# Patient Record
Sex: Female | Born: 2000 | Race: White | Hispanic: No | Marital: Single | Smoking: Never smoker
Health system: Southern US, Community
[De-identification: ages and names within clinical notes are randomized; demographics above are authoritative.]

## PROBLEM LIST (undated history)

## (undated) DIAGNOSIS — J45909 Unspecified asthma, uncomplicated: Secondary | ICD-10-CM

---
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
## 2019-11-23 ENCOUNTER — Other Ambulatory Visit: Payer: Self-pay

## 2019-11-23 ENCOUNTER — Encounter: Payer: Self-pay | Admitting: Family Medicine

## 2019-11-23 ENCOUNTER — Ambulatory Visit (INDEPENDENT_AMBULATORY_CARE_PROVIDER_SITE_OTHER): Payer: BC Managed Care – PPO | Admitting: Family Medicine

## 2019-11-23 VITALS — BP 102/60 | HR 91 | Temp 97.1°F | Ht 62.25 in | Wt 227.4 lb

## 2019-11-23 DIAGNOSIS — J4540 Moderate persistent asthma, uncomplicated: Secondary | ICD-10-CM | POA: Diagnosis not present

## 2019-11-23 DIAGNOSIS — Z2882 Immunization not carried out because of caregiver refusal: Secondary | ICD-10-CM | POA: Insufficient documentation

## 2019-11-23 DIAGNOSIS — Z7689 Persons encountering health services in other specified circumstances: Secondary | ICD-10-CM

## 2019-11-23 MED ORDER — ALBUTEROL SULFATE 1.25 MG/3ML IN NEBU: 1.0000 | INHALATION_SOLUTION | Freq: Four times a day (QID) | RESPIRATORY_TRACT | 0 refills | Status: DC | PRN

## 2019-11-23 MED ORDER — QVAR REDIHALER 40 MCG/ACT IN AERB: 1.0000 | INHALATION_SPRAY | Freq: Two times a day (BID) | RESPIRATORY_TRACT | 11 refills | Status: DC

## 2019-11-23 MED ORDER — ALBUTEROL SULFATE HFA 108 (90 BASE) MCG/ACT IN AERS: 2.0000 | INHALATION_SPRAY | RESPIRATORY_TRACT | 2 refills | Status: DC | PRN

## 2019-11-23 NOTE — Progress Notes (Signed)
Subjective:    Patient ID: Sonya Brown, female    DOB: 10-20-2001, 18 y.o.   MRN: [id number]  HPI Chief Complaint  Patient presents with  . New Patient (Initial Visit)    Needs asthma medication refills.    This is a 18 yo female who presents today to establish care. She works in childcare with 2 years ago. Enjoys. Lives with parents and siblings, 78 (oldest brother is married and has son). Enjoys reading, esp. mysteries.   Last CPE- October for work. Tdap- declines Flu-declines Eye- September, glassed for driving/reading Dental- June 2010 Exercise- Walking, hiking Diet- Eats 2-3 meals a day, mom does shopping, cooks some, little fast food. Rare soda, occasional sweet tea/juice. Mostly water. Brings lunch to work, sandwich, ramen, frozen entrees. Eats some fruit, little vegetables. Likes mac and cheese, pizza.  Menses- regular, monthly, heavy, lasting 6 days. Takes ibuprofen.  Sleep- 8 hours, feels rested  Asthma- not doing well for about 3 months. Having to use her albuterol almost every day. Was seen at [facility] 10/20. Was on preventative in 2009. Has had allergy testing in the past, allergic to cat, dogs, mold, pollen. Takes a daily antihistamine. Has cats inside, dog outside.   Past Medical History:  Diagnosis Date  . Asthma    Past Surgical History:  Procedure Laterality Date  . TONSILLECTOMY  2005   Family History  Problem Relation Age of Onset  . Diabetes Mother   . Heart attack Maternal Grandfather   . Heart attack Maternal Uncle    Social History   Tobacco Use  . Smoking status: Never Smoker  . Smokeless tobacco: Never Used  Substance Use Topics  . Alcohol use: No  . Drug use: No    Review of Systems Per HPI    Objective:   Physical Exam Vitals reviewed.  Constitutional:      General: She is not in acute distress.    Appearance: Normal appearance. She is obese. She is not ill-appearing, toxic-appearing or diaphoretic.  HENT:     Head:  Normocephalic and atraumatic.     Right Ear: Tympanic membrane, ear canal and external ear normal.     Left Ear: Tympanic membrane, ear canal and external ear normal.     Nose: Nose normal.     Mouth/Throat:     Mouth: Mucous membranes are dry.     Pharynx: Oropharynx is clear.  Eyes:     Conjunctiva/sclera: Conjunctivae normal.  Cardiovascular:     Rate and Rhythm: Normal rate and regular rhythm.     Heart sounds: Normal heart sounds.  Pulmonary:     Effort: Pulmonary effort is normal. No respiratory distress.     Breath sounds: No stridor. Wheezing present. No rhonchi or rales.  Skin:    General: Skin is warm and dry.  Neurological:     Mental Status: She is alert and oriented to person, place, and time.  Psychiatric:        Mood and Affect: Mood normal.        Behavior: Behavior normal.        Thought Content: Thought content normal.        Judgment: Judgment normal.       BP 102/60 (BP Location: Left Arm, Patient Position: Sitting, Cuff Size: Normal)   Pulse 91   Temp (!) 97.1 F (36.2 C) (Temporal)   Ht 5' 2.25" (1.581 m)   Wt 227 lb 6.4 oz (103.1 kg)   SpO2  96%   BMI 41.26 kg/m  Wt Readings from Last 3 Encounters:  11/23/19 227 lb 6.4 oz (103.1 kg)  10/02/16 211 lb 13.8 oz (96.1 kg) (98 %, Z= 2.14)*  06/13/15 200 lb 11.2 oz (91 kg) (98 %, Z= 2.08)*   * Growth percentiles are based on CDC (Girls, 2-20 Years) data.       Assessment & Plan:  1. Encounter to establish care - reviewed available records  2. Moderate persistent asthma without complication - provided written and verbal information regarding disease and importance of treatment - beclomethasone (QVAR REDIHALER) 40 MCG/ACT inhaler; Inhale 1 puff into the lungs 2 (two) times daily.  Dispense: 10.6 g; Refill: 11 - albuterol (VENTOLIN HFA) 108 (90 Base) MCG/ACT inhaler; Inhale 2 puffs into the lungs every 4 (four) hours as needed for wheezing or shortness of breath.  Dispense: 8 g; Refill: 2 - albuterol  (ACCUNEB) 1.25 MG/3ML nebulizer solution; Take 3 mLs (1.25 mg total) by nebulization every 6 (six) hours as needed for wheezing.  Inhale 3 mL every 6 hours by inhalation route as needed.  Dispense: 45 mL; Refill: 0  3. Parent refuses immunizations - encouraged her to get Tdap and flu due to work environment and asthma, patient did not want today  4. Morbid obesity ([place]) - provided information regarding healthy, balanced diet. Will discuss obtaining labs at follow up.  This visit occurred during the SARS-CoV-2 public health emergency.  Safety protocols were in place, including screening questions prior to the visit, additional usage of staff PPE, and extensive cleaning of exam room while observing appropriate contact time as indicated for disinfecting solutions.    Clarene Reamer, FNP-BC  [facility]  11/23/2019 9:05 AM

## 2019-11-23 NOTE — Patient Instructions (Addendum)
It was a pleasure to meet you today  Please schedule a follow up visit in 6 weeks  I have sent in a daily inhaler for you to use twice a day. Rinse your mouth out after using.   Obtaining a healthy weight will help with your asthma as well as overall well being. See below for additional information-  A resource that I like is www.dietdoctor.com/diabetes/diet  Here are some guidelines to help you with meal planning -  Avoid all processed and packaged foods (bread, pasta, crackers, chips, etc) and beverages containing calories.  Avoid added sugars and excessive natural sugars.  Attention to how you feel if you consume artificial sweeteners.  Do they make you more hungry or raise your blood sugar?  With every meal and snack, aim to get 20 g of protein (3 ounces of meat, 4 ounces of fish, 3 eggs, protein powder, 1 cup [place] yogurt, 1 cup cottage cheese, etc.)  Increase fiber in the form of non-starchy vegetables.  These help you feel full with very little carbohydrates and are good for gut health.  Eat 1 serving healthy carb per meal- 1/2 cup brown rice, beans, potato, corn- pay attention to whether or not this makes you feel fatigued. If it does, reduce the frequency you consume these.   Eat 2-3 servings of lower sugar fruits daily.  This includes berries, apples, oranges, peaches, pears, one half banana.  Have small amounts of good fats such as avocado, nuts, olive oil, nut butters, olives.  Add a little cheese to your salads to make them tasty.     Asthma, Adult  Asthma is a long-term (chronic) condition in which the airways get tight and narrow. The airways are the breathing passages that lead from the nose and mouth down into the lungs. A person with asthma will have times when symptoms get worse. These are called asthma attacks. They can cause coughing, whistling sounds when you breathe (wheezing), shortness of breath, and chest pain. They can make it hard to breathe. There is no cure  for asthma, but medicines and lifestyle changes can help control it. There are many things that can bring on an asthma attack or make asthma symptoms worse (triggers). Common triggers include:  Mold.  Dust.  Cigarette smoke.  Cockroaches.  Things that can cause allergy symptoms (allergens). These include animal skin flakes (dander) and pollen from trees or grass.  Things that pollute the air. These may include household cleaners, wood smoke, smog, or chemical odors.  Cold air, weather changes, and wind.  Crying or laughing hard.  Stress.  Certain medicines or drugs.  Certain foods such as dried fruit, potato chips, and grape juice.  Infections, such as a cold or the flu.  Certain medical conditions or diseases.  Exercise or tiring activities. Asthma may be treated with medicines and by staying away from the things that cause asthma attacks. Types of medicines may include:  Controller medicines. These help prevent asthma symptoms. They are usually taken every day.  Fast-acting reliever or rescue medicines. These quickly relieve asthma symptoms. They are used as needed and provide short-term relief.  Allergy medicines if your attacks are brought on by allergens.  Medicines to help control the body's defense (immune) system. Follow these instructions at home: Avoiding triggers in your home  Change your heating and air conditioning filter often.  Limit your use of fireplaces and wood stoves.  Get rid of pests (such as roaches and mice) and their droppings.  Throw away plants if you see mold on them.  Clean your floors. Dust regularly. Use cleaning products that do not smell.  Have someone vacuum when you are not home. Use a vacuum cleaner with a HEPA filter if possible.  Replace carpet with wood, tile, or vinyl flooring. Carpet can trap animal skin flakes and dust.  Use allergy-proof pillows, mattress covers, and box spring covers.  Wash bed sheets and blankets  every week in hot water. Dry them in a dryer.  Keep your bedroom free of any triggers.  Avoid pets and keep windows closed when things that cause allergy symptoms are in the air.  Use blankets that are made of polyester or cotton.  Clean bathrooms and kitchens with bleach. If possible, have someone repaint the walls in these rooms with mold-resistant paint. Keep out of the rooms that are being cleaned and painted.  Wash your hands often with soap and water. If soap and water are not available, use hand sanitizer.  Do not allow anyone to smoke in your home. General instructions  Take over-the-counter and prescription medicines only as told by your doctor. ? Talk with your doctor if you have questions about how or when to take your medicines. ? Make note if you need to use your medicines more often than usual.  Do not use any products that contain nicotine or tobacco, such as cigarettes and e-cigarettes. If you need help quitting, ask your doctor.  Stay away from secondhand smoke.  Avoid doing things outdoors when allergen counts are high and when air quality is low.  Wear a ski mask when doing outdoor activities in the winter. The mask should cover your nose and mouth. Exercise indoors on cold days if you can.  Warm up before you exercise. Take time to cool down after exercise.  Use a peak flow meter as told by your doctor. A peak flow meter is a tool that measures how well the lungs are working.  Keep track of the peak flow meter's readings. Write them down.  Follow your asthma action plan. This is a written plan for taking care of your asthma and treating your attacks.  Make sure you get all the shots (vaccines) that your doctor recommends. Ask your doctor about a flu shot and a pneumonia shot.  Keep all follow-up visits as told by your doctor. This is important. Contact a doctor if:  You have wheezing, shortness of breath, or a cough even while taking medicine to prevent  attacks.  The mucus you cough up (sputum) is thicker than usual.  The mucus you cough up changes from clear or white to yellow, green, gray, or bloody.  You have problems from the medicine you are taking, such as: ? A rash. ? Itching. ? Swelling. ? Trouble breathing.  You need reliever medicines more than 2-3 times a week.  Your peak flow reading is still at 50-79% of your personal best after following the action plan for 1 hour.  You have a fever. Get help right away if:  You seem to be worse and are not responding to medicine during an asthma attack.  You are short of breath even at rest.  You get short of breath when doing very little activity.  You have trouble eating, drinking, or talking.  You have chest pain or tightness.  You have a fast heartbeat.  Your lips or fingernails start to turn blue.  You are light-headed or dizzy, or you faint.  Your peak  flow is less than 50% of your personal best.  You feel too tired to breathe normally. Summary  Asthma is a long-term (chronic) condition in which the airways get tight and narrow. An asthma attack can make it hard to breathe.  Asthma cannot be cured, but medicines and lifestyle changes can help control it.  Make sure you understand how to avoid triggers and how and when to use your medicines. This information is not intended to replace advice given to you by your health care provider. Make sure you discuss any questions you have with your health care provider. Document Released: 05/07/2008 Document Revised: 01/22/2019 Document Reviewed: 12/24/2016 Elsevier Patient Education  2020 [employer]

## 2020-01-06 ENCOUNTER — Ambulatory Visit (INDEPENDENT_AMBULATORY_CARE_PROVIDER_SITE_OTHER): Payer: BC Managed Care – PPO | Admitting: Family Medicine

## 2020-01-06 ENCOUNTER — Other Ambulatory Visit: Payer: Self-pay

## 2020-01-06 ENCOUNTER — Encounter: Payer: Self-pay | Admitting: Family Medicine

## 2020-01-06 VITALS — BP 100/68 | HR 85 | Temp 97.7°F | Ht 62.0 in | Wt 234.0 lb

## 2020-01-06 DIAGNOSIS — J4540 Moderate persistent asthma, uncomplicated: Secondary | ICD-10-CM

## 2020-01-06 NOTE — Progress Notes (Signed)
   Subjective:    Patient ID: Sonya Brown, female    DOB: January 18, 2001, 18 y.o.   MRN: [id number]  HPI This is a 19 yo female who presents today for follow up of asthma. Started Qvar after last visit. Has only had to use her rescue inhaler x 1 and her nebulizer x 0. She denies cough, wheeze, SOB.   Past Medical History:  Diagnosis Date  . Asthma    Past Surgical History:  Procedure Laterality Date  . TONSILLECTOMY  2005   Family History  Problem Relation Age of Onset  . Diabetes Mother   . Heart attack Maternal Grandfather   . Heart attack Maternal Uncle    Social History   Tobacco Use  . Smoking status: Never Smoker  . Smokeless tobacco: Never Used  Substance Use Topics  . Alcohol use: No  . Drug use: No      Review of Systems Per HPI    Objective:   Physical Exam Vitals reviewed.  Constitutional:      General: She is not in acute distress.    Appearance: Normal appearance. She is obese. She is not ill-appearing, toxic-appearing or diaphoretic.  HENT:     Head: Normocephalic and atraumatic.  Eyes:     Conjunctiva/sclera: Conjunctivae normal.  Cardiovascular:     Rate and Rhythm: Normal rate.     Heart sounds: Normal heart sounds.  Pulmonary:     Effort: Pulmonary effort is normal.     Breath sounds: Normal breath sounds.  Neurological:     Mental Status: She is alert and oriented to person, place, and time.  Psychiatric:        Mood and Affect: Mood normal.        Behavior: Behavior normal.        Thought Content: Thought content normal.        Judgment: Judgment normal.       BP 100/68   Pulse 85   Temp 97.7 F (36.5 C)   Ht 5\' 2"  (1.575 m)   Wt 234 lb (106.1 kg)   LMP 12/14/2019 (Approximate)   SpO2 98%   BMI 42.80 kg/m  Wt Readings from Last 3 Encounters:  01/06/20 234 lb (106.1 kg)  11/23/19 227 lb 6.4 oz (103.1 kg)  10/02/16 211 lb 13.8 oz (96.1 kg) (98 %, Z= 2.14)*   * Growth percentiles are based on CDC (Girls, 2-20 Years) data.        Assessment & Plan:  1. Moderate persistent asthma without complication - significant improvement since starting Qvar - reminded her to rinse mouth after use  - discussed following up in 6 months, after turning 19 for complete physical exam, pap, labs. She has declined labs, vaccinations in the past. Encouraged her to consider but not delay care if she does not want labs/pap.    10/04/16, FNP-BC  [facility], [facility]  01/06/2020 9:11 AM

## 2020-01-06 NOTE — Patient Instructions (Signed)
Good to see you today  Please follow up in about 6 months for your complete physical exam with labs

## 2021-01-11 ENCOUNTER — Telehealth: Payer: Self-pay | Admitting: Family Medicine

## 2021-01-11 DIAGNOSIS — J4540 Moderate persistent asthma, uncomplicated: Secondary | ICD-10-CM

## 2021-01-11 MED ORDER — ALBUTEROL SULFATE HFA 108 (90 BASE) MCG/ACT IN AERS: 2.0000 | INHALATION_SPRAY | RESPIRATORY_TRACT | 0 refills | Status: DC | PRN

## 2021-01-11 MED ORDER — QVAR REDIHALER 40 MCG/ACT IN AERB
1.0000 | INHALATION_SPRAY | Freq: Two times a day (BID) | RESPIRATORY_TRACT | 0 refills | Status: DC
Start: 2021-01-11 — End: 2023-06-27

## 2021-01-11 MED ORDER — ALBUTEROL SULFATE 1.25 MG/3ML IN NEBU: 1.0000 | INHALATION_SOLUTION | Freq: Four times a day (QID) | RESPIRATORY_TRACT | 0 refills | Status: DC | PRN

## 2021-01-11 NOTE — Telephone Encounter (Signed)
Patient states that she needs refills on all her medications. She is currently not in [facility] and she states that she is about out of all her meds. Albuterol (both) Beclomethasone  Pharmacy: [facility] [place], [place]

## 2021-01-11 NOTE — Addendum Note (Signed)
Addended by: Karenann Cai on: 01/11/2021 02:39 PM   Modules accepted: Orders

## 2021-01-11 NOTE — Telephone Encounter (Signed)
Pharmacy requests refill on: Albuterol 1.25 mg/3 mL, Albuterol 108 mcg/act inhaler, beclomethasone 40 mcg/act inhaler   LAST REFILL: 11/23/2019 LAST OV: 01/06/2020 NEXT OV: Not Scheduled  PHARMACY: [facility] [place], [place]

## 2023-06-27 ENCOUNTER — Encounter: Payer: Self-pay | Admitting: Family Medicine

## 2023-06-27 ENCOUNTER — Ambulatory Visit (INDEPENDENT_AMBULATORY_CARE_PROVIDER_SITE_OTHER): Payer: BC Managed Care – PPO | Admitting: Family Medicine

## 2023-06-27 VITALS — BP 105/73 | HR 107 | Ht 62.0 in | Wt 259.4 lb

## 2023-06-27 DIAGNOSIS — J4540 Moderate persistent asthma, uncomplicated: Secondary | ICD-10-CM

## 2023-06-27 DIAGNOSIS — Z6841 Body Mass Index (BMI) 40.0 and over, adult: Secondary | ICD-10-CM | POA: Diagnosis not present

## 2023-06-27 MED ORDER — ALBUTEROL SULFATE HFA 108 (90 BASE) MCG/ACT IN AERS: 2.0000 | INHALATION_SPRAY | RESPIRATORY_TRACT | 11 refills | Status: DC | PRN

## 2023-06-27 MED ORDER — QVAR REDIHALER 40 MCG/ACT IN AERB: 1.0000 | INHALATION_SPRAY | Freq: Two times a day (BID) | RESPIRATORY_TRACT | 11 refills | Status: DC

## 2023-06-27 MED ORDER — ALBUTEROL SULFATE 1.25 MG/3ML IN NEBU: 1.0000 | INHALATION_SOLUTION | Freq: Four times a day (QID) | RESPIRATORY_TRACT | 11 refills | Status: DC | PRN

## 2023-06-27 NOTE — Progress Notes (Signed)
New Patient Office Visit  Subjective    Patient ID: Sonya Brown, female    DOB: Apr 03, 2001  Age: 22 y.o. MRN: [id number]  CC:  Chief Complaint  Patient presents with   New Patient (Initial Visit)    HPI Sonya Brown presents to establish care  Patient was pretty going to [facility] in [place] but her PCP moved.  Has not gone in over a year.  Was being treated for asthma.  Used Qvar daily and albuterol as needed.  Previously needed the albuterol once every 2 weeks but now that she is out of Qvar she needs the albuterol every day.  PMH: asthma  PSH: none  FH: mgm - breast cancer at 22 yo.   Tobacco use: no Alcohol use: no Drug use: no Marital status: single Employment: works in Development worker, community at [employer]  Sexual hx: none No birth control.   monthly     Outpatient Encounter Medications as of 06/27/2023  Medication Sig   [DISCONTINUED] albuterol (ACCUNEB) 1.25 MG/3ML nebulizer solution Take 3 mLs (1.25 mg total) by nebulization every 6 (six) hours as needed for wheezing.  Inhale 3 mL every 6 hours by inhalation route as needed.   [DISCONTINUED] albuterol (VENTOLIN HFA) 108 (90 Base) MCG/ACT inhaler Inhale 2 puffs into the lungs every 4 (four) hours as needed for wheezing or shortness of breath.   [DISCONTINUED] beclomethasone (QVAR REDIHALER) 40 MCG/ACT inhaler Inhale 1 puff into the lungs 2 (two) times daily.   albuterol (ACCUNEB) 1.25 MG/3ML nebulizer solution Take 3 mLs (1.25 mg total) by nebulization every 6 (six) hours as needed for wheezing.  Inhale 3 mL every 6 hours by inhalation route as needed.   albuterol (VENTOLIN HFA) 108 (90 Base) MCG/ACT inhaler Inhale 2 puffs into the lungs every 4 (four) hours as needed for wheezing or shortness of breath.   beclomethasone (QVAR REDIHALER) 40 MCG/ACT inhaler Inhale 1 puff into the lungs 2 (two) times daily.   No facility-administered encounter medications on file as of 06/27/2023.    Past Medical History:   Diagnosis Date   Asthma     Past Surgical History:  Procedure Laterality Date   TONSILLECTOMY  2005    Family History  Problem Relation Age of Onset   Diabetes Mother    Heart attack Maternal Grandfather    Heart attack Maternal Uncle     Social History   Socioeconomic History   Marital status: Single    Spouse name: Not on file   Number of children: Not on file   Years of education: Not on file   Highest education level: Not on file  Occupational History   Not on file  Tobacco Use   Smoking status: Never   Smokeless tobacco: Never  Vaping Use   Vaping status: Never Used  Substance and Sexual Activity   Alcohol use: No   Drug use: No   Sexual activity: Never    Birth control/protection: Abstinence  Other Topics Concern   Not on file  Social History Narrative   Not on file   Social Determinants of Health   Financial Resource Strain: Not on file  Food Insecurity: Not on file  Transportation Needs: Not on file  Physical Activity: Not on file  Stress: Not on file  Social Connections: Not on file  Intimate Partner Violence: Not on file    ROS      Objective    BP 105/73   Pulse (!) 107  Ht 5\' 2"  (1.575 m)   Wt 259 lb 6.4 oz (117.7 kg)   LMP 05/29/2023   SpO2 98%   BMI 47.44 kg/m   Physical Exam General: Alert, oriented HEENT: PERRLA, EOMI, normal TM CV: Regular rhythm Pulmonary: Lungs clear bilaterally GI: Soft, nontender.  Normal bowel sounds MSK: Strength equal bilaterally Neuro: Cranial nerves II through XII grossly intact Psych: Pleasant affect       Assessment & Plan:   Class 3 severe obesity due to excess calories without serious comorbidity with body mass index (BMI) of 40.0 to 44.9 in adult [facility]) -     Lipid panel; Future -     Hemoglobin A1c; Future -     Basic metabolic panel; Future  Moderate persistent asthma without complication -     Qvar RediHaler; Inhale 1 puff into the lungs 2 (two) times daily.  Dispense: 10.6  g; Refill: 11 -     Albuterol Sulfate; Take 3 mLs (1.25 mg total) by nebulization every 6 (six) hours as needed for wheezing.  Inhale 3 mL every 6 hours by inhalation route as needed.  Dispense: 45 mL; Refill: 11 -     Albuterol Sulfate HFA; Inhale 2 puffs into the lungs every 4 (four) hours as needed for wheezing or shortness of breath.  Dispense: 8 g; Refill: 11    Return in about 1 year (around 06/26/2024) for physical.   Sandre Kitty, MD

## 2023-06-27 NOTE — Patient Instructions (Signed)
It was nice to see you today,  We addressed the following topics today: - I have sent in a refill for your inhalers - you will need to come in for labs sometime in the next few weeks.   Have a great day,  Frederic Jericho, MD

## 2023-06-28 ENCOUNTER — Other Ambulatory Visit: Payer: BC Managed Care – PPO

## 2023-06-29 LAB — BASIC METABOLIC PANEL WITH GFR
BUN/Creatinine Ratio: 20 (ref 9–23)
BUN: 15 mg/dL (ref 6–20)
CO2: 20 mmol/L (ref 20–29)
Calcium: 9.5 mg/dL (ref 8.7–10.2)
Chloride: 100 mmol/L (ref 96–106)
Creatinine, Ser: 0.74 mg/dL (ref 0.57–1.00)
Glucose: 89 mg/dL (ref 70–99)
Potassium: 4.5 mmol/L (ref 3.5–5.2)
Sodium: 136 mmol/L (ref 134–144)
eGFR: 116 mL/min/1.73

## 2023-11-22 ENCOUNTER — Ambulatory Visit (INDEPENDENT_AMBULATORY_CARE_PROVIDER_SITE_OTHER): Payer: BC Managed Care – PPO | Admitting: Family Medicine

## 2023-11-22 ENCOUNTER — Encounter: Payer: Self-pay | Admitting: Family Medicine

## 2023-11-22 VITALS — BP 110/72 | HR 82 | Ht 62.0 in | Wt 243.1 lb

## 2023-11-22 DIAGNOSIS — J45901 Unspecified asthma with (acute) exacerbation: Secondary | ICD-10-CM | POA: Diagnosis not present

## 2023-11-22 MED ORDER — AIRSUPRA 90-80 MCG/ACT IN AERO: 2.0000 | INHALATION_SPRAY | RESPIRATORY_TRACT | 5 refills | Status: AC | PRN

## 2023-11-22 MED ORDER — BUDESONIDE-FORMOTEROL FUMARATE 160-4.5 MCG/ACT IN AERO: 2.0000 | INHALATION_SPRAY | Freq: Two times a day (BID) | RESPIRATORY_TRACT | 3 refills | Status: DC

## 2023-11-22 NOTE — Assessment & Plan Note (Signed)
Seems to a mild exacerbation triggered by viral respiratory infection.  Switching up her inhalers from Qvar to Symbicort and albuterol to Airsupra.  Advised patient that if she is not feeling better by Monday when our office is open again to let us know and we can prescribe oral steroids.

## 2023-11-22 NOTE — Patient Instructions (Addendum)
It was nice to see you today,  We addressed the following topics today: -I have sent in 2 new inhalers. - Paulene Floor should be used as your new rescue inhaler instead of albuterol - Symbicort should be used as your maintenance inhaler instead of Qvar. - If by Monday you do not feel like your symptoms are improving or if you feel like you are getting worse let us know and we can send in the prescription for oral steroids.   - Follow-up with me in 1 month to make sure these new medications are working.  Have a great day, Frederic Jericho, MD

## 2023-11-22 NOTE — Progress Notes (Signed)
   Acute Office Visit  Subjective:     Patient ID: Sonya Brown, female    DOB: [birth year], 22 y.o.   MRN: [id number]  Chief Complaint  Patient presents with   Asthma    HPI Patient is in today for asthma  Patient is taking Qvar and albuterol.  Has an albuterol inhaler as well.  For the past week she has felt like her asthma is getting worse feels like her chest is "tight".  Her home O2 sats have been in the 92 to 95% range.  Uses her albuterol 2 puffs every 4 hours.  Patient's family have all been developing respiratory infections since Thanksgiving.  We discussed alternative inhalers.  Discussed switching Qvar to Symbicort and adding Airsupra instead of albuterol.  Patient agreeable to this.  Patient also taking Mucinex. ROS      Objective:    BP 110/72   Pulse 82   Ht 5\' 2"  (1.575 m)   Wt 243 lb 1.9 oz (110.3 kg)   LMP 10/31/2023   BMI 44.47 kg/m    Physical Exam General: Alert, oriented CV: Regular rhythm Pulmonary: Bilateral inspiratory and expiratory wheezing.  No respiratory distress or tachypnea.  No results found for any visits on 11/22/23.      Assessment & Plan:   Mild asthma with exacerbation, unspecified whether persistent Assessment & Plan: Seems to a mild exacerbation triggered by viral respiratory infection.  Switching up her inhalers from Qvar to Symbicort and albuterol to Airsupra.  Advised patient that if she is not feeling better by Monday when our office is open again to let us know and we can prescribe oral steroids.   Other orders -     Budesonide-Formoterol Fumarate; Inhale 2 puffs into the lungs 2 (two) times daily.  Dispense: 1 each; Refill: 3 -     Airsupra; Inhale 2 puffs into the lungs every 4 (four) hours as needed.  Dispense: 32.1 g; Refill: 5     Return in about 4 weeks (around 12/20/2023) for asthma.  Sandre Kitty, MD

## 2023-11-25 ENCOUNTER — Telehealth: Payer: Self-pay

## 2023-11-25 ENCOUNTER — Other Ambulatory Visit: Payer: Self-pay | Admitting: Family Medicine

## 2023-11-25 MED ORDER — PREDNISONE 20 MG PO TABS: 40.0000 mg | ORAL_TABLET | Freq: Every day | ORAL | 0 refills | Status: AC

## 2023-11-25 NOTE — Telephone Encounter (Signed)
Copied from CRM [id number]. Topic: Clinical - Medication Question >> Nov 25, 2023 10:40 AM Donita Brooks wrote: Reason for CRM: pt was told by Dr Constance Goltz on visit if things get worse to give call. Pt was wondering if Dr. Reuel Boom can prescribe her medication that him and her discuss on her visit - oxygen level goes back down on 93 and 92

## 2023-11-25 NOTE — Telephone Encounter (Signed)
Called pt she is advised of her Rx 

## 2023-11-25 NOTE — Telephone Encounter (Signed)
Please let the patient know that a prescription for 5 days of 40 mg prednisone was sent in.

## 2023-12-31 NOTE — Progress Notes (Unsigned)
   Established Patient Office Visit  Subjective   Patient ID: Sonya Brown, female    DOB: 10-Jul-2001  Age: 23 y.o. MRN: [id number]  No chief complaint on file.   HPI  Ashtma - symbicort and Paulene Floor now.  Prednisone.    The ASCVD Risk score (Arnett DK, et al., 2019) failed to calculate for the following reasons:   The 2019 ASCVD risk score is only valid for ages 31 to 52  Health Maintenance Due  Topic Date Due   Pneumococcal Vaccine 58-67 Years old (1 of 2 - PCV) Never done   HPV VACCINES (1 - 3-dose series) Never done   HIV Screening  Never done   Hepatitis C Screening  Never done   DTaP/Tdap/Td (1 - Tdap) Never done   Cervical Cancer Screening (Pap smear)  Never done   INFLUENZA VACCINE  Never done   COVID-19 Vaccine (1 - 2024-25 season) Never done      Objective:     There were no vitals taken for this visit. {Vitals History (Optional):23777}  Physical Exam   No results found for any visits on 01/01/24.      Assessment & Plan:   There are no diagnoses linked to this encounter.   No follow-ups on file.    Sandre Kitty, MD

## 2024-01-01 ENCOUNTER — Ambulatory Visit (INDEPENDENT_AMBULATORY_CARE_PROVIDER_SITE_OTHER): Payer: BC Managed Care – PPO | Admitting: Family Medicine

## 2024-01-01 ENCOUNTER — Encounter: Payer: Self-pay | Admitting: Family Medicine

## 2024-01-01 VITALS — BP 115/81 | HR 97 | Ht 62.0 in | Wt 248.1 lb

## 2024-01-01 DIAGNOSIS — H6993 Unspecified Eustachian tube disorder, bilateral: Secondary | ICD-10-CM | POA: Diagnosis not present

## 2024-01-01 NOTE — Assessment & Plan Note (Signed)
Was given azithromycin and a steroid by an urgent care on 1/6.  Also started using Flonase and second-generation histamine at that time which she continues to use.  Pain is better but still has issues with feeling like her ears are congested and decreased hearing.  This is episodic.  Overall exam was normal but there was some small area of blood on the right tympanic membrane but was otherwise normal.  Advised her to continue antihistamine and nasal steroid.  Will send in referral to ENT.

## 2024-01-01 NOTE — Patient Instructions (Signed)
It was nice to see you today,  We addressed the following topics today: -Continue using your Flonase and allergy medicine daily. - I am sending a referral to the ear nose and throat doctor.  If your symptoms resolved before they call you to schedule you can tell them you do not need to make an appointment at this time.  Have a great day,  Frederic Jericho, MD

## 2024-03-12 ENCOUNTER — Ambulatory Visit (INDEPENDENT_AMBULATORY_CARE_PROVIDER_SITE_OTHER): Payer: BC Managed Care – PPO | Admitting: Audiology

## 2024-03-12 ENCOUNTER — Ambulatory Visit (INDEPENDENT_AMBULATORY_CARE_PROVIDER_SITE_OTHER): Payer: BC Managed Care – PPO | Admitting: Otolaryngology

## 2024-03-12 ENCOUNTER — Encounter (INDEPENDENT_AMBULATORY_CARE_PROVIDER_SITE_OTHER): Payer: Self-pay

## 2024-03-12 VITALS — BP 114/81 | HR 74 | Ht 62.0 in | Wt 250.0 lb

## 2024-03-12 DIAGNOSIS — H938X3 Other specified disorders of ear, bilateral: Secondary | ICD-10-CM

## 2024-03-12 DIAGNOSIS — H900 Conductive hearing loss, bilateral: Secondary | ICD-10-CM

## 2024-03-12 DIAGNOSIS — H65493 Other chronic nonsuppurative otitis media, bilateral: Secondary | ICD-10-CM | POA: Diagnosis not present

## 2024-03-12 DIAGNOSIS — H6993 Unspecified Eustachian tube disorder, bilateral: Secondary | ICD-10-CM | POA: Diagnosis not present

## 2024-03-12 MED ORDER — PREDNISONE 20 MG PO TABS: 20.0000 mg | ORAL_TABLET | Freq: Every day | ORAL | 0 refills | Status: AC

## 2024-03-12 MED ORDER — AZELASTINE HCL 0.1 % NA SOLN: 2.0000 | Freq: Two times a day (BID) | NASAL | 12 refills | Status: AC

## 2024-03-12 NOTE — Progress Notes (Signed)
  [address], [address] [place], [place] [address] [phone]  Audiological Evaluation    Name: Sonya Brown     DOB:   Dec 14, 2000      MRN:   [id number]                                                                                     Service Date: 03/12/2024     Accompanied by: unaccompanied    Patient comes today after Dr. Allena Katz, ENT sent a referral for a hearing evaluation due to concerns with hearing loss.   Symptoms Yes Details  Hearing loss  [x]  Both ears  Tinnitus  [x]  yes  Ear pain/ infections/pressure  [x]  Pressure AU since she was sick December 2024  Balance problems  []    Noise exposure history  []    Previous ear surgeries  []    Family history of hearing loss  []    Amplification  []    Other  []      Otoscopy: Right ear: Abnormal eardrum appearance. Left ear:  Abnormal eardrum appearance.  Tympanometry: Right ear: Type B- Normal external ear canal volume with no middle ear pressure peak or tympanic membrane compliance Left ear: Type B- Normal external ear canal volume with no middle ear pressure peak or tympanic membrane compliance    Pure tone Audiometry: Right ear- Normal to moderately severe conductive hearing loss from 125 Hz - 8000 Hz. Left ear-  Normal to moderately severe conductive hearing loss from 125 Hz - 8000 Hz.  Speech Audiometry: Right ear- Speech Reception Threshold (SRT) was obtained at 35 dBHL. Left ear-Speech Reception Threshold (SRT) was obtained at 40 dBHL.   Word Recognition Score Tested using NU-6 (MLV) Right ear: 96% was obtained at a presentation level of 75 dBHL with contralateral masking which is deemed as  excellent. Left ear: 100% was obtained at a presentation level of 75 dBHL with contralateral masking which is deemed as  excellent.   The hearing test results were completed under headphones and results are deemed to be of good to fair reliability. Test technique:  conventional      Recommendations: Follow up with  ENT as scheduled for today. Repeat audiogram after medical care.   Panagiota Perfetti MARIE LEROUX-MARTINEZ, AUD

## 2024-03-12 NOTE — Progress Notes (Signed)
 Dear Dr. Arabella Beach, Here is my assessment for our mutual patient, Sonya Brown. Thank you for allowing me the opportunity to care for your patient. Please do not hesitate to contact me should you have any other questions. Sincerely, Dr. Milon Aloe  Otolaryngology Clinic Note Referring provider: Dr. Arabella Beach HPI:  Sonya Brown is a 23 y.o. female kindly referred by Dr. Arabella Beach for evaluation of bilateral ear fullness and discomfort.   Initial visit (03/2024): Patient reports: she had a URI symptoms in late December, and then started to have bilateral ear discomfort and fullness ("like driving through a mountain"). She went to an UC and was prescribed azithromycin  and prednisone  and it helped from a nasal standpoint but not really from ear standpoint. She also started on flonase and has been using it for a couple of months (two sprays each nostril daily) and PO anthistamine.  She does have allergies and asthma and she takes antihistamine chronically because she has a cat and is allergic to cats. No nasal symptoms currently; ear discomfort has resolved, but still left ear and right ear are both full --- actually worse since Feb. Hearing is also down. No popping. No frequent sinus infections or prior sinus surgery.  Patient denies: ear pain,vertigo, drainage, tinnitus Patient also denies barotrauma, vestibular suppressant use, ototoxic medication use Prior ear surgery: no No frequent ear infections prior. Before this episode, she never has had ear issues.  H&N Surgery: no Personal or FHx of bleeding dz or anesthesia difficulty: no  GLP-1: no AP/AC: no  Tobacco: no. PMHx: Asthma, Hypothyroidism  Independent Review of Additional Tests or Records:  Dr. Arabella Beach (11/22/2023): Noted asthma worse, and developing resp infections since thansgiving. Taking mucinex; Dx: Asthma with exacerbation ; on 01/01/2024: ear discomfort in January, rx azithro and pred 12/09/2023; on flonase and PO anthistamine; intermittent  congestion and decreased hearing; Dx: ETD; Rx: ref to ENT, continue nasal steroid and anthistamine Labs: BMP 06/28/2023: BUN/Cr 15/0.74 03/2024 Audiogram was independently reviewed and interpreted by me and it reveals - B/B tymps; normal to mod-severe CHL b/l with ABG up to 50 dB; WRT >96% AU at 75dB   SNHL= Sensorineural hearing loss   PMH/Meds/All/SocHx/FamHx/ROS:   Past Medical History:  Diagnosis Date   Asthma      Past Surgical History:  Procedure Laterality Date   TONSILLECTOMY  2005    Family History  Problem Relation Age of Onset   Diabetes Mother    Heart attack Maternal Grandfather    Heart attack Maternal Uncle      Social Connections: Not on file      Current Outpatient Medications:    Albuterol -Budesonide  (AIRSUPRA ) 90-80 MCG/ACT AERO, Inhale 2 puffs into the lungs every 4 (four) hours as needed., Disp: 32.1 g, Rfl: 5   azelastine  (ASTELIN ) 0.1 % nasal spray, Place 2 sprays into both nostrils 2 (two) times daily. Use in each nostril as directed, Disp: 30 mL, Rfl: 12   budesonide -formoterol  (SYMBICORT ) 160-4.5 MCG/ACT inhaler, Inhale 2 puffs into the lungs 2 (two) times daily., Disp: 1 each, Rfl: 3   predniSONE  (DELTASONE ) 20 MG tablet, Take 1 tablet (20 mg total) by mouth daily with breakfast., Disp: 7 tablet, Rfl: 0   albuterol  (ACCUNEB ) 1.25 MG/3ML nebulizer solution, Take 3 mLs (1.25 mg total) by nebulization every 6 (six) hours as needed for wheezing.  Inhale 3 mL every 6 hours by inhalation route as needed. (Patient not taking: Reported on 03/12/2024), Disp: 45 mL, Rfl: 11   albuterol  (VENTOLIN  HFA)  108 (90 Base) MCG/ACT inhaler, Inhale 2 puffs into the lungs every 4 (four) hours as needed for wheezing or shortness of breath. (Patient not taking: Reported on 03/12/2024), Disp: 8 g, Rfl: 11   Physical Exam:   BP 114/81 (BP Location: Left Arm, Patient Position: Sitting, Cuff Size: Large)   Pulse 74   Ht 5\' 2"  (1.575 m)   Wt 250 lb (113.4 kg)   SpO2 96%   BMI  45.73 kg/m   Salient findings:  CN II-XII intact Given history and complaints, ear microscopy was indicated and performed for evaluation with findings as below in physical exam section and in procedures; Bilateral EAC clear and TM intact with well pneumatized middle ear spaces; mild pars flaccida retraction; b/l serous effusion Weber 512: mid Rinne 512: AC > BC b/l  Anterior rhinoscopy: Septum relatively midline; bilateral inferior turbinates with mild hypertrophy No lesions of oral cavity/oropharynx No obviously palpable neck masses/lymphadenopathy/thyromegaly No respiratory distress or stridor  Seprately Identifiable Procedures:  Prior to initiating any procedures, risks/benefits/alternatives were explained to the patient and verbal consent obtained. Procedure: Bilateral ear microscopy using microscope (CPT [id number]) Pre-procedure diagnosis: bilateral conductive hearing loss Post-procedure diagnosis: same Indication: see above; given patient's otologic complaints and history, for improved and comprehensive examination of external ear and tympanic membrane, bilateral otologic examination using microscope was performed  Procedure: Patient was placed semi-recumbent. Both ear canals were examined using the microscope with findings above Patient tolerated the procedure well.   Impression & Plans:  Sonya Brown is a 23 y.o. female with:  1. Sensation of fullness in both ears   2. Chronic otitis media of both ears with effusion   3. Dysfunction of both eustachian tubes   4. Conductive hearing loss, bilateral    Noted b/l CHL and ear fullness and pressure after URI since at least Nov/Dec 2024 with worsening fullness. B/l effusions noted today and B/B tymps We discussed options for likely ETD: 1. Medical mgmt 2. BTT It has been about 2 months since her sx worsened and after discussion of R/B/A, she opted for medical management.  Will do a short course of prednisone , continue flonase BID and  start astelin  BID Autoinsufflate ears multiple times per day F/u 6 weeks; if sx still persistent, will consider BTT   See below regarding exact medications prescribed this encounter including dosages and route: Meds ordered this encounter  Medications   predniSONE  (DELTASONE ) 20 MG tablet    Sig: Take 1 tablet (20 mg total) by mouth daily with breakfast.    Dispense:  7 tablet    Refill:  0   azelastine  (ASTELIN ) 0.1 % nasal spray    Sig: Place 2 sprays into both nostrils 2 (two) times daily. Use in each nostril as directed    Dispense:  30 mL    Refill:  12      Thank you for allowing me the opportunity to care for your patient. Please do not hesitate to contact me should you have any other questions.  Sincerely, Milon Aloe, MD Otolaryngologist (ENT), [facility] Phone: [phone] Fax: [phone]  03/30/2024, 4:48 PM   MDM:  Level 4 Complexity/Problems addressed: mod - chronic problems, worsening sx Data complexity: mod - independent review of notes, labs, ordering tests - Morbidity: mod  - Prescription Drug prescribed or managed: yes

## 2024-03-12 NOTE — Patient Instructions (Signed)
 Use flonase nasal spray two sprays each nostril twice per day; right after, use the astelin nasal spray - two sprays each nostril twice per day Take prednisone 20mg  once daily - take in the morning;

## 2024-05-04 ENCOUNTER — Ambulatory Visit (INDEPENDENT_AMBULATORY_CARE_PROVIDER_SITE_OTHER): Admitting: Otolaryngology

## 2024-06-02 ENCOUNTER — Other Ambulatory Visit: Payer: Self-pay | Admitting: Family Medicine

## 2024-06-02 MED ORDER — BUDESONIDE-FORMOTEROL FUMARATE 160-4.5 MCG/ACT IN AERO: 2.0000 | INHALATION_SPRAY | Freq: Two times a day (BID) | RESPIRATORY_TRACT | 12 refills | Status: DC

## 2024-06-04 ENCOUNTER — Ambulatory Visit (INDEPENDENT_AMBULATORY_CARE_PROVIDER_SITE_OTHER): Admitting: Otolaryngology

## 2024-06-24 ENCOUNTER — Ambulatory Visit (INDEPENDENT_AMBULATORY_CARE_PROVIDER_SITE_OTHER): Admitting: Otolaryngology

## 2024-06-24 ENCOUNTER — Encounter (INDEPENDENT_AMBULATORY_CARE_PROVIDER_SITE_OTHER): Payer: Self-pay | Admitting: Otolaryngology

## 2024-06-24 VITALS — BP 112/79 | HR 86 | Ht 62.0 in | Wt 250.0 lb

## 2024-06-24 DIAGNOSIS — H900 Conductive hearing loss, bilateral: Secondary | ICD-10-CM | POA: Diagnosis not present

## 2024-06-24 DIAGNOSIS — H6993 Unspecified Eustachian tube disorder, bilateral: Secondary | ICD-10-CM

## 2024-06-24 DIAGNOSIS — H938X3 Other specified disorders of ear, bilateral: Secondary | ICD-10-CM

## 2024-06-24 NOTE — Progress Notes (Signed)
 Dear Dr. Chandra, Here is my assessment for our mutual patient, Sonya Brown. Thank you for allowing me the opportunity to care for your patient. Please do not hesitate to contact me should you have any other questions. Sincerely, Dr. Eldora Blanch  Otolaryngology Clinic Note Referring provider: Dr. Chandra HPI:  Sonya Brown is a 23 y.o. female kindly referred by Dr. Chandra for evaluation of bilateral ear fullness and discomfort.   Initial visit (03/2024): Patient reports: she had a URI symptoms in late December, and then started to have bilateral ear discomfort and fullness (like driving through a mountain). She went to an UC and was prescribed azithromycin  and prednisone  and it helped from a nasal standpoint but not really from ear standpoint. She also started on flonase and has been using it for a couple of months (two sprays each nostril daily) and PO anthistamine.  She does have allergies and asthma and she takes antihistamine chronically because she has a cat and is allergic to cats. No nasal symptoms currently; ear discomfort has resolved, but still left ear and right ear are both full --- actually worse since Feb. Hearing is also down. No popping. No frequent sinus infections or prior sinus surgery.  Patient denies: ear pain,vertigo, drainage, tinnitus Patient also denies barotrauma, vestibular suppressant use, ototoxic medication use Prior ear surgery: no No frequent ear infections prior. Before this episode, she never has had ear issues.  --------------------------------------------------------- 06/24/2024 She reports that her ears are doing much better. Did flonase, astelin , and popped her ears. No fullness, drainage, no problems with hearing. No sinus symptoms. Not been sick recently. She feels like she is back to normal.  H&N Surgery: no Personal or FHx of bleeding dz or anesthesia difficulty: no  GLP-1: no AP/AC: no  Tobacco: no. PMHx: Asthma, Hypothyroidism  Independent  Review of Additional Tests or Records:  Dr. Chandra (11/22/2023): Noted asthma worse, and developing resp infections since thansgiving. Taking mucinex; Dx: Asthma with exacerbation ; on 01/01/2024: ear discomfort in January, rx azithro and pred 12/09/2023; on flonase and PO anthistamine; intermittent congestion and decreased hearing; Dx: ETD; Rx: ref to ENT, continue nasal steroid and anthistamine Labs: BMP 06/28/2023: BUN/Cr 15/0.74 03/2024 Audiogram was independently reviewed and interpreted by me and it reveals - B/B tymps; normal to mod-severe CHL b/l with ABG up to 50 dB; WRT >96% AU at 75dB   SNHL= Sensorineural hearing loss   PMH/Meds/All/SocHx/FamHx/ROS:   Past Medical History:  Diagnosis Date   Asthma      Past Surgical History:  Procedure Laterality Date   TONSILLECTOMY  2005    Family History  Problem Relation Age of Onset   Diabetes Mother    Heart attack Maternal Grandfather    Heart attack Maternal Uncle      Social Connections: Not on file      Current Outpatient Medications:    Albuterol -Budesonide  (AIRSUPRA ) 90-80 MCG/ACT AERO, Inhale 2 puffs into the lungs every 4 (four) hours as needed., Disp: 32.1 g, Rfl: 5   azelastine  (ASTELIN ) 0.1 % nasal spray, Place 2 sprays into both nostrils 2 (two) times daily. Use in each nostril as directed, Disp: 30 mL, Rfl: 12   budesonide -formoterol  (BREYNA ) 160-4.5 MCG/ACT inhaler, Inhale 2 puffs into the lungs 2 (two) times daily., Disp: 1 each, Rfl: 12   budesonide -formoterol  (SYMBICORT ) 160-4.5 MCG/ACT inhaler, Inhale 2 puffs into the lungs 2 (two) times daily., Disp: 1 each, Rfl: 3   predniSONE  (DELTASONE ) 20 MG tablet, Take 1 tablet (20 mg  total) by mouth daily with breakfast., Disp: 7 tablet, Rfl: 0   albuterol  (ACCUNEB ) 1.25 MG/3ML nebulizer solution, Take 3 mLs (1.25 mg total) by nebulization every 6 (six) hours as needed for wheezing.  Inhale 3 mL every 6 hours by inhalation route as needed. (Patient not taking: Reported on  06/24/2024), Disp: 45 mL, Rfl: 11   albuterol  (VENTOLIN  HFA) 108 (90 Base) MCG/ACT inhaler, Inhale 2 puffs into the lungs every 4 (four) hours as needed for wheezing or shortness of breath. (Patient not taking: Reported on 06/24/2024), Disp: 8 g, Rfl: 11   Physical Exam:   BP 112/79 (BP Location: Right Arm, Patient Position: Sitting, Cuff Size: Large)   Pulse 86   Ht 5' 2 (1.575 m)   Wt 250 lb (113.4 kg)   SpO2 94%   BMI 45.73 kg/m   Salient findings:  CN II-XII intact Given history and complaints, ear microscopy was indicated and performed for evaluation with findings as below in physical exam section and in procedures; Bilateral EAC clear and TM intact with well pneumatized middle ear spaces; mild pars flaccida retraction; no effusion noted today Weber 512: mid Rinne 512: AC > BC b/l  Anterior rhinoscopy: Septum relatively midline; bilateral inferior turbinates with mild hypertrophy  Seprately Identifiable Procedures:  Prior to initiating any procedures, risks/benefits/alternatives were explained to the patient and verbal consent obtained. Procedure: Bilateral ear microscopy using microscope (CPT [id number]) Pre-procedure diagnosis: bilateral conductive hearing loss, eustachian tube dysfunction Post-procedure diagnosis: same Indication: see above; given patient's otologic complaints and history, for improved and comprehensive examination of external ear and tympanic membrane, bilateral otologic examination using microscope was performed  Procedure: Patient was placed semi-recumbent. Both ear canals were examined using the microscope with findings above Patient tolerated the procedure well.   Impression & Plans:  Sonya Brown is a 23 y.o. female with:  1. Conductive hearing loss, bilateral   2. Sensation of fullness in both ears   3. Dysfunction of both eustachian tubes    Noted b/l CHL and ear fullness and pressure after URI since at least Nov/Dec 2024 with worsening fullness. B/l  effusions noted prior with B/B tymps. Improved after medical mgmt Given improvement, will get repeat audio to ensure conductive component has resolved  F/u 6 weeks with audio with PA, continue sprays until that point   See below regarding exact medications prescribed this encounter including dosages and route: No orders of the defined types were placed in this encounter.     Thank you for allowing me the opportunity to care for your patient. Please do not hesitate to contact me should you have any other questions.  Sincerely, Eldora Blanch, MD Otolaryngologist (ENT), [facility] Phone: [phone] Fax: [phone]  06/28/2024, 9:44 PM   MDM:  Level 3 - 99213 Complexity/Problems addressed: mod - chronic problems Data complexity: low - Morbidity: low - Prescription Drug prescribed or managed:

## 2024-06-26 ENCOUNTER — Encounter: Payer: BC Managed Care – PPO | Admitting: Family Medicine

## 2024-08-21 ENCOUNTER — Ambulatory Visit (INDEPENDENT_AMBULATORY_CARE_PROVIDER_SITE_OTHER): Admitting: Audiology

## 2024-08-21 ENCOUNTER — Ambulatory Visit (INDEPENDENT_AMBULATORY_CARE_PROVIDER_SITE_OTHER): Admitting: Physician Assistant

## 2024-08-26 ENCOUNTER — Encounter: Payer: Self-pay | Admitting: Family Medicine

## 2024-08-26 ENCOUNTER — Ambulatory Visit (INDEPENDENT_AMBULATORY_CARE_PROVIDER_SITE_OTHER): Admitting: Family Medicine

## 2024-08-26 VITALS — BP 118/82 | HR 80 | Ht 62.0 in | Wt 262.4 lb

## 2024-08-26 DIAGNOSIS — J4540 Moderate persistent asthma, uncomplicated: Secondary | ICD-10-CM

## 2024-08-26 DIAGNOSIS — Z0000 Encounter for general adult medical examination without abnormal findings: Secondary | ICD-10-CM

## 2024-08-26 DIAGNOSIS — E66813 Obesity, class 3: Secondary | ICD-10-CM

## 2024-08-26 DIAGNOSIS — Z6841 Body Mass Index (BMI) 40.0 and over, adult: Secondary | ICD-10-CM

## 2024-08-26 MED ORDER — BUDESONIDE-FORMOTEROL FUMARATE 160-4.5 MCG/ACT IN AERO: 2.0000 | INHALATION_SPRAY | Freq: Two times a day (BID) | RESPIRATORY_TRACT | 12 refills | Status: AC

## 2024-08-26 NOTE — Patient Instructions (Signed)
 It was nice to see you today,  We addressed the following topics today: -I am sending in a refill of your Breyna .  When you need a refill of your Airsupra  and let us  know. - Please schedule a lab visit from your way out.  Have a great day,  Rolan Slain, MD

## 2024-08-26 NOTE — Progress Notes (Signed)
 Annual physical  Subjective    Patient ID: Sonya Brown, female    DOB: October 11, 2001  Age: 22 y.o. MRN: [id number]  Chief Complaint  Patient presents with   Annual Exam   HPI Sonya Brown is a 23 y.o. old female here  for annual exam.   Subjective - Routine physical examination. Reports no new issues or concerns. - Followed up with ENT specialist since last visit, reports ears are much better. - Asthma: Reports occasional use of rescue inhaler. Triggers include forgetting allergy medication or significant outdoor exposure. No recent exacerbations.  Medications: Airsupra  (albuterol /budesonide ) as needed for rescue, Breyna  daily for maintenance, and a generic over-the-counter antihistamine ([employer] brand of Claritin or Zyrtec).  PMH, PSH, FH, Social Hx: PMHx: Asthma, allergic rhinitis, history of elevated cholesterol (last checked last year). PSH: None mentioned. FH: Not discussed. Social Hx: Works in [employer] at [employer]. Lives with parents. Denies tobacco or alcohol use. Not currently in a relationship or sexually active. Reports regular menstrual cycles. Diet is a regular diet with no specific restrictions. Exercise consists of walking/running around at work.  ROS: Denies any new issues or concerns. Pertinent positives: occasional wheezing requiring rescue inhaler. Pertinent negatives: no recent asthma exacerbations.    The ASCVD Risk score (Arnett DK, et al., 2019) failed to calculate for the following reasons:   The 2019 ASCVD risk score is only valid for ages 49 to 26  Health Maintenance Due  Topic Date Due   HPV VACCINES (1 - 3-dose series) Never done   HIV Screening  Never done   Hepatitis C Screening  Never done   DTaP/Tdap/Td (1 - Tdap) Never done   Pneumococcal Vaccine (1 of 2 - PCV) Never done   Hepatitis B Vaccines 19-59 Average Risk (1 of 3 - 19+ 3-dose series) Never done   Cervical Cancer Screening (Pap smear)  Never done   COVID-19 Vaccine (1 -  2024-25 season) Never done      Objective:     BP 118/82   Pulse 80   Ht 5' 2 (1.575 m)   Wt 262 lb 6.4 oz (119 kg)   LMP 08/19/2024   SpO2 98%   BMI 47.99 kg/m    Physical Exam Gen: alert, oriented HEENT: perrla, eomi, mmm CV: rrr, no murmur Pulm: lctab. No wheeze or crackles.  GI: soft, nbs.  Nontender to palpation MSK: strength equal b/l. Normal gait Ext: no pedal edema Skin: warm and dry, no rashes Psych: pleasant affect.  Spontaneous speech   No results found for any visits on 08/26/24.      Assessment & Plan:   Physical exam, annual  Moderate persistent asthma without complication Assessment & Plan: - Continues on Breyna  for maintenance and Airsupra  for rescue. Reports occasional use of rescue inhaler, typically when allergy symptoms are present. Lungs are clear on exam. - Refill for Breynasent to pharmacy. - Continue current management. Advised to let us  know if refills are needed for other inhalers.  Orders: -     CBC with Differential/Platelet; Future  Healthcare maintenance Assessment & Plan: - Is due for annual labs, including a lipid panel. Pap smear was discussed as they are of age to begin screening; has never had one and does not have a gynecologist. Declined Pap smear at this time. Declined influenza vaccine today. - Schedule lab appointment for fasting labs. - Schedule follow-up for next year's physical exam. - Will defer Pap smear for now.   Class 3  severe obesity due to excess calories without serious comorbidity with body mass index (BMI) of 40.0 to 44.9 in adult -     Comprehensive metabolic panel with GFR; Future -     Lipid panel; Future -     TSH; Future -     Hemoglobin A1c; Future  Other orders -     Budesonide -Formoterol  Fumarate; Inhale 2 puffs into the lungs 2 (two) times daily.  Dispense: 1 each; Refill: 12     Return in about 1 year (around 08/26/2025) for physical.    Toribio MARLA Slain, MD

## 2024-08-29 DIAGNOSIS — Z0000 Encounter for general adult medical examination without abnormal findings: Secondary | ICD-10-CM | POA: Insufficient documentation

## 2024-08-29 NOTE — Assessment & Plan Note (Signed)
-   Continues on Breyna  for maintenance and Airsupra  for rescue. Reports occasional use of rescue inhaler, typically when allergy symptoms are present. Lungs are clear on exam. - Refill for Breynasent to pharmacy. - Continue current management. Advised to let us  know if refills are needed for other inhalers.

## 2024-08-29 NOTE — Assessment & Plan Note (Signed)
-   Is due for annual labs, including a lipid panel. Pap smear was discussed as they are of age to begin screening; has never had one and does not have a gynecologist. Declined Pap smear at this time. Declined influenza vaccine today. - Schedule lab appointment for fasting labs. - Schedule follow-up for next year's physical exam. - Will defer Pap smear for now.

## 2024-09-06 ENCOUNTER — Other Ambulatory Visit: Payer: Self-pay | Admitting: Family Medicine

## 2024-09-06 DIAGNOSIS — J4540 Moderate persistent asthma, uncomplicated: Secondary | ICD-10-CM

## 2024-10-08 ENCOUNTER — Ambulatory Visit (INDEPENDENT_AMBULATORY_CARE_PROVIDER_SITE_OTHER): Admitting: Audiology

## 2024-11-20 ENCOUNTER — Encounter (INDEPENDENT_AMBULATORY_CARE_PROVIDER_SITE_OTHER): Payer: Self-pay

## 2024-11-23 ENCOUNTER — Ambulatory Visit (INDEPENDENT_AMBULATORY_CARE_PROVIDER_SITE_OTHER): Admitting: Audiology

## 2024-12-14 ENCOUNTER — Telehealth: Payer: Self-pay | Admitting: *Deleted

## 2024-12-14 NOTE — Telephone Encounter (Signed)
 LVM to call office to reschedule appt that is scheduled on 08/30/25 due to provider being out of the office. Please assist in getting this rescheduled if pt calls back.  Will also send a mychart message.

## 2025-01-01 ENCOUNTER — Ambulatory Visit (INDEPENDENT_AMBULATORY_CARE_PROVIDER_SITE_OTHER): Admitting: Family Medicine

## 2025-01-01 ENCOUNTER — Encounter: Payer: Self-pay | Admitting: Family Medicine

## 2025-01-01 VITALS — BP 105/76 | HR 102 | Temp 98.4°F | Ht 62.0 in | Wt 251.4 lb

## 2025-01-01 DIAGNOSIS — J111 Influenza due to unidentified influenza virus with other respiratory manifestations: Secondary | ICD-10-CM | POA: Diagnosis not present

## 2025-01-01 LAB — POC SOFIA 2 FLU + SARS ANTIGEN FIA
Influenza A, POC: NEGATIVE
Influenza B, POC: POSITIVE — AB
SARS Coronavirus 2 Ag: NEGATIVE

## 2025-01-01 NOTE — Progress Notes (Signed)
" ° ° °  Subjective   Patient ID: Sonya Brown, female    DOB: 2001/09/15  Age: 24 y.o. MRN: [id number]  Chief Complaint  Patient presents with   Otalgia     History of Present Illness   Sonya Brown is a 24 year old female with asthma who presents with flu-like symptoms.  She has had flu-like symptoms for almost a week with body aches, fevers up to 102.46F, chills, nausea, and prior vomiting. Vomiting stopped yesterday but nausea continues. She denies blood in the vomit.  She also has cough, nasal congestion, and sore throat. She had close contact with a coworker who tested positive for the flu last Thursday and has not been tested herself.  She has been using over-the-counter cold and flu medications, including Tylenol . Her respiratory symptoms have worsened her asthma, and she has needed her asthma treatments a few times, while continuing her maintenance inhaler as prescribed. She has been out of work all week due to her illness.          The ASCVD Risk score (Arnett DK, et al., 2019) failed to calculate for the following reasons:   The 2019 ASCVD risk score is only valid for ages 76 to 23   * - Cholesterol units were assumed  Health Maintenance Due  Topic Date Due   HPV VACCINES (1 - Risk 3-dose series) Never done   HIV Screening  Never done   Hepatitis C Screening  Never done   DTaP/Tdap/Td (1 - Tdap) Never done   Pneumococcal Vaccine (1 of 2 - PCV) Never done   Hepatitis B Vaccines 19-59 Average Risk (1 of 3 - 19+ 3-dose series) Never done   Cervical Cancer Screening (Pap smear)  Never done   COVID-19 Vaccine (1 - 2025-26 season) Never done      Objective:     BP 105/76   Pulse (!) 102   Temp 98.4 F (36.9 C)   Ht 5' 2 (1.575 m)   Wt 251 lb 6.4 oz (114 kg)   LMP 01/01/2025   BMI 45.98 kg/m    Physical Exam   Gen: alert, oriented Heent: normal oropharynx.  Cv: rrr, no murmur Pulm: no respiratory distress.  No wheezing or crackles.  Psych: pleasant  affect       Results for orders placed or performed in visit on 01/01/25  POC SOFIA 2 FLU + SARS ANTIGEN FIA  Result Value Ref Range   Influenza A, POC Negative Negative   Influenza B, POC Positive (A) Negative   SARS Coronavirus 2 Ag Negative Negative        Assessment & Plan:   Influenza Assessment & Plan: Symptoms consistent with influenza-like illness. Positive for flu B. Tamiflu not indicated due to symptom duration. - Use ibuprofen  and acetaminophen  for fever and headache. - Use Afrin nasal spray twice daily for congestion. - Use Cepacol lozenges for sore throat. - Use dextromethorphan and guaifenesin for cough. - Consider nighttime antihistamines for sleep aid. - continue asthma rescue inhaler as needed.  - Provided work note for absence through Monday.  Orders: -     POC SOFIA 2 FLU + SARS ANTIGEN FIA         Return if symptoms worsen or fail to improve.    Toribio MARLA Slain, MD  "

## 2025-01-01 NOTE — Patient Instructions (Addendum)
" ° °  YOUR PLAN: You have symptoms consistent with the flu, including body aches, fever, chills, nausea, cough, nasal congestion, and sore throat. - you tested positive for flu B -Use ibuprofen  and acetaminophen  for fever and headache. -Use Afrin nasal spray twice daily for congestion for up to 5 days. -Use Cepacol lozenges for sore throat. -Use over the counter dextromethorphan and guaifenesin for cough. -Consider nighttime antihistamines for sleep aid. -You have been provided with a work note for absence through Monday.  ASTHMA WITH ACUTE EXACERBATION: Your asthma has worsened due to the flu-like symptoms. -Continue using your maintenance inhaler as prescribed.    "

## 2025-01-01 NOTE — Assessment & Plan Note (Addendum)
 Symptoms consistent with influenza-like illness. Positive for flu B. Tamiflu not indicated due to symptom duration. - Use ibuprofen  and acetaminophen  for fever and headache. - Use Afrin nasal spray twice daily for congestion. - Use Cepacol lozenges for sore throat. - Use dextromethorphan and guaifenesin for cough. - Consider nighttime antihistamines for sleep aid. - continue asthma rescue inhaler as needed.  - Provided work note for absence through Monday.

## 2025-01-05 ENCOUNTER — Other Ambulatory Visit: Payer: Self-pay | Admitting: Family Medicine

## 2025-01-05 ENCOUNTER — Encounter: Payer: Self-pay | Admitting: Family Medicine

## 2025-01-05 MED ORDER — ONDANSETRON 4 MG PO TBDP: 4.0000 mg | ORAL_TABLET | Freq: Three times a day (TID) | ORAL | 0 refills | Status: AC | PRN

## 2025-08-30 ENCOUNTER — Encounter: Admitting: Family Medicine
